# Patient Record
Sex: Male | Born: 1975 | Race: Black or African American | Hispanic: No | Marital: Married | Smoking: Current every day smoker
Health system: Southern US, Community
[De-identification: ages and names within clinical notes are randomized; demographics above are authoritative.]

## PROBLEM LIST (undated history)

## (undated) DIAGNOSIS — J40 Bronchitis, not specified as acute or chronic: Secondary | ICD-10-CM

## (undated) DIAGNOSIS — I10 Essential (primary) hypertension: Secondary | ICD-10-CM

---

## 1998-04-30 ENCOUNTER — Emergency Department (HOSPITAL_COMMUNITY): Admission: EM | Admit: 1998-04-30 | Discharge: 1998-04-30 | Payer: Self-pay | Admitting: Emergency Medicine

## 1998-04-30 ENCOUNTER — Encounter: Payer: Self-pay | Admitting: Emergency Medicine

## 1998-12-14 ENCOUNTER — Emergency Department (HOSPITAL_COMMUNITY): Admission: EM | Admit: 1998-12-14 | Discharge: 1998-12-14 | Payer: Self-pay | Admitting: Emergency Medicine

## 1999-11-26 ENCOUNTER — Emergency Department (HOSPITAL_COMMUNITY): Admission: EM | Admit: 1999-11-26 | Discharge: 1999-11-26 | Payer: Self-pay | Admitting: Emergency Medicine

## 2000-11-18 ENCOUNTER — Emergency Department (HOSPITAL_COMMUNITY): Admission: EM | Admit: 2000-11-18 | Discharge: 2000-11-18 | Payer: Self-pay | Admitting: *Deleted

## 2002-06-28 ENCOUNTER — Emergency Department (HOSPITAL_COMMUNITY): Admission: AD | Admit: 2002-06-28 | Discharge: 2002-06-28 | Payer: Self-pay | Admitting: Emergency Medicine

## 2002-06-28 ENCOUNTER — Encounter: Payer: Self-pay | Admitting: Emergency Medicine

## 2002-07-07 ENCOUNTER — Encounter: Admission: RE | Admit: 2002-07-07 | Discharge: 2002-07-07 | Payer: Self-pay | Admitting: Internal Medicine

## 2002-07-28 ENCOUNTER — Encounter: Admission: RE | Admit: 2002-07-28 | Discharge: 2002-07-28 | Payer: Self-pay | Admitting: Internal Medicine

## 2002-08-08 ENCOUNTER — Emergency Department (HOSPITAL_COMMUNITY): Admission: EM | Admit: 2002-08-08 | Discharge: 2002-08-08 | Payer: Self-pay | Admitting: Emergency Medicine

## 2002-08-08 ENCOUNTER — Encounter: Payer: Self-pay | Admitting: Emergency Medicine

## 2002-08-29 ENCOUNTER — Encounter: Admission: RE | Admit: 2002-08-29 | Discharge: 2002-08-29 | Payer: Self-pay | Admitting: Internal Medicine

## 2002-09-26 ENCOUNTER — Encounter: Admission: RE | Admit: 2002-09-26 | Discharge: 2002-09-26 | Payer: Self-pay | Admitting: Internal Medicine

## 2002-12-02 ENCOUNTER — Encounter: Admission: RE | Admit: 2002-12-02 | Discharge: 2002-12-02 | Payer: Self-pay | Admitting: Internal Medicine

## 2003-01-26 ENCOUNTER — Encounter: Admission: RE | Admit: 2003-01-26 | Discharge: 2003-01-26 | Payer: Self-pay | Admitting: Internal Medicine

## 2004-11-13 ENCOUNTER — Ambulatory Visit: Payer: Self-pay | Admitting: Internal Medicine

## 2004-11-14 ENCOUNTER — Ambulatory Visit: Payer: Self-pay | Admitting: Internal Medicine

## 2004-12-12 ENCOUNTER — Ambulatory Visit: Payer: Self-pay | Admitting: Internal Medicine

## 2005-03-28 ENCOUNTER — Emergency Department (HOSPITAL_COMMUNITY): Admission: EM | Admit: 2005-03-28 | Discharge: 2005-03-28 | Payer: Self-pay | Admitting: Family Medicine

## 2005-08-05 ENCOUNTER — Ambulatory Visit: Payer: Self-pay | Admitting: Hospitalist

## 2006-08-13 ENCOUNTER — Encounter: Payer: Self-pay | Admitting: Internal Medicine

## 2006-08-13 ENCOUNTER — Ambulatory Visit: Payer: Self-pay | Admitting: Internal Medicine

## 2006-08-13 LAB — CONVERTED CEMR LAB
ALT: 40 units/L (ref 0–53)
AST: 32 units/L (ref 0–37)
Albumin: 4.8 g/dL (ref 3.5–5.2)
Alkaline Phosphatase: 85 units/L (ref 39–117)
BUN: 9 mg/dL (ref 6–23)
CO2: 23 meq/L (ref 19–32)
Calcium: 9.7 mg/dL (ref 8.4–10.5)
Chloride: 101 meq/L (ref 96–112)
Creatinine, Ser: 0.92 mg/dL (ref 0.40–1.50)
Glucose, Bld: 96 mg/dL (ref 70–99)
Potassium: 3.6 meq/L (ref 3.5–5.3)
Sodium: 140 meq/L (ref 135–145)
Total Bilirubin: 0.4 mg/dL (ref 0.3–1.2)
Total Protein: 7.9 g/dL (ref 6.0–8.3)

## 2006-08-14 ENCOUNTER — Telehealth: Payer: Self-pay | Admitting: *Deleted

## 2006-08-21 ENCOUNTER — Ambulatory Visit: Payer: Self-pay | Admitting: Hospitalist

## 2006-08-21 ENCOUNTER — Encounter (INDEPENDENT_AMBULATORY_CARE_PROVIDER_SITE_OTHER): Payer: Self-pay | Admitting: Internal Medicine

## 2006-08-21 DIAGNOSIS — R05 Cough: Secondary | ICD-10-CM | POA: Insufficient documentation

## 2006-08-21 DIAGNOSIS — F17200 Nicotine dependence, unspecified, uncomplicated: Secondary | ICD-10-CM

## 2006-08-21 DIAGNOSIS — I10 Essential (primary) hypertension: Secondary | ICD-10-CM | POA: Insufficient documentation

## 2006-09-10 ENCOUNTER — Encounter (INDEPENDENT_AMBULATORY_CARE_PROVIDER_SITE_OTHER): Payer: Self-pay | Admitting: Internal Medicine

## 2006-09-10 ENCOUNTER — Ambulatory Visit: Payer: Self-pay | Admitting: Internal Medicine

## 2006-09-11 LAB — CONVERTED CEMR LAB
BUN: 10 mg/dL (ref 6–23)
CO2: 28 meq/L (ref 19–32)
Calcium: 9.9 mg/dL (ref 8.4–10.5)
Chloride: 101 meq/L (ref 96–112)
Creatinine, Ser: 1.02 mg/dL (ref 0.40–1.50)
Creatinine, Urine: 287.5 mg/dL
Glucose, Bld: 92 mg/dL (ref 70–99)
Microalb Creat Ratio: 3.8 mg/g (ref 0.0–30.0)
Microalb, Ur: 1.09 mg/dL (ref 0.00–1.89)
Potassium: 3.6 meq/L (ref 3.5–5.3)
Sodium: 141 meq/L (ref 135–145)

## 2008-03-20 ENCOUNTER — Telehealth (INDEPENDENT_AMBULATORY_CARE_PROVIDER_SITE_OTHER): Payer: Self-pay | Admitting: Internal Medicine

## 2012-04-19 ENCOUNTER — Encounter (HOSPITAL_COMMUNITY): Payer: Self-pay | Admitting: *Deleted

## 2012-04-19 ENCOUNTER — Emergency Department (HOSPITAL_COMMUNITY)
Admission: EM | Admit: 2012-04-19 | Discharge: 2012-04-20 | Disposition: A | Discharge status: Home / Self Care | Payer: 59 | Attending: Emergency Medicine | Admitting: Emergency Medicine

## 2012-04-19 ENCOUNTER — Emergency Department (HOSPITAL_COMMUNITY)
Admission: EM | Admit: 2012-04-19 | Discharge: 2012-04-19 | Disposition: A | Discharge status: Other Acute Inpatient Hospital | Payer: 59 | Source: Home / Self Care | Attending: Emergency Medicine | Admitting: Emergency Medicine

## 2012-04-19 ENCOUNTER — Emergency Department (INDEPENDENT_AMBULATORY_CARE_PROVIDER_SITE_OTHER): Payer: 59

## 2012-04-19 DIAGNOSIS — F17200 Nicotine dependence, unspecified, uncomplicated: Secondary | ICD-10-CM | POA: Insufficient documentation

## 2012-04-19 DIAGNOSIS — R000 Tachycardia, unspecified: Secondary | ICD-10-CM

## 2012-04-19 DIAGNOSIS — R0602 Shortness of breath: Secondary | ICD-10-CM

## 2012-04-19 DIAGNOSIS — I10 Essential (primary) hypertension: Secondary | ICD-10-CM

## 2012-04-19 DIAGNOSIS — J40 Bronchitis, not specified as acute or chronic: Secondary | ICD-10-CM

## 2012-04-19 HISTORY — DX: Essential (primary) hypertension: I10

## 2012-04-19 HISTORY — DX: Bronchitis, not specified as acute or chronic: J40

## 2012-04-19 LAB — COMPREHENSIVE METABOLIC PANEL
AST: 27 U/L (ref 0–37)
BUN: 8 mg/dL (ref 6–23)
CO2: 26 mEq/L (ref 19–32)
Chloride: 97 mEq/L (ref 96–112)
Creatinine, Ser: 1.01 mg/dL (ref 0.50–1.35)
GFR calc Af Amer: >90 mL/min (ref >90)
GFR calc non Af Amer: >90 mL/min (ref >90)
Glucose, Bld: 103 mg/dL — ABNORMAL HIGH (ref 70–99)
Total Bilirubin: 1.4 mg/dL — ABNORMAL HIGH (ref 0.3–1.2)

## 2012-04-19 LAB — COMPREHENSIVE METABOLIC PANEL WITH GFR
ALT: 22 U/L (ref 0–53)
Albumin: 4.1 g/dL (ref 3.5–5.2)
Alkaline Phosphatase: 101 U/L (ref 39–117)
Calcium: 9.7 mg/dL (ref 8.4–10.5)
Potassium: 3.4 meq/L — ABNORMAL LOW (ref 3.5–5.1)
Sodium: 135 meq/L (ref 135–145)
Total Protein: 7.7 g/dL (ref 6.0–8.3)

## 2012-04-19 LAB — CBC
HCT: 47.3 % (ref 39.0–52.0)
Hemoglobin: 17.2 g/dL — ABNORMAL HIGH (ref 13.0–17.0)
MCH: 32.4 pg (ref 26.0–34.0)
MCHC: 36.4 g/dL — ABNORMAL HIGH (ref 30.0–36.0)
MCV: 89.1 fL (ref 78.0–100.0)
Platelets: 233 10*3/uL (ref 150–400)
RBC: 5.31 MIL/uL (ref 4.22–5.81)
RDW: 12.9 % (ref 11.5–15.5)
WBC: 8.9 10*3/uL (ref 4.0–10.5)

## 2012-04-19 LAB — POCT I-STAT TROPONIN I

## 2012-04-19 MED ORDER — LISINOPRIL 20 MG PO TABS
20.0000 mg | ORAL_TABLET | Freq: Once | ORAL | Status: AC
  Administered 2012-04-19: 20 mg via ORAL
  Filled 2012-04-19: qty 1

## 2012-04-19 MED ORDER — ALBUTEROL SULFATE (5 MG/ML) 0.5% IN NEBU
5.0000 mg | INHALATION_SOLUTION | Freq: Once | RESPIRATORY_TRACT | Status: AC
  Administered 2012-04-19: 5 mg via RESPIRATORY_TRACT
  Filled 2012-04-19: qty 1

## 2012-04-19 MED ORDER — IPRATROPIUM BROMIDE 0.02 % IN SOLN
0.5000 mg | Freq: Once | RESPIRATORY_TRACT | Status: AC
  Administered 2012-04-19: 0.5 mg via RESPIRATORY_TRACT
  Filled 2012-04-19: qty 2.5

## 2012-04-19 MED ORDER — SODIUM CHLORIDE 0.9 % IV BOLUS (SEPSIS)
1000.0000 mL | Freq: Once | INTRAVENOUS | Status: AC
  Administered 2012-04-19: 1000 mL via INTRAVENOUS

## 2012-04-19 MED ORDER — LORAZEPAM 1 MG PO TABS
1.0000 mg | ORAL_TABLET | Freq: Once | ORAL | Status: AC
  Administered 2012-04-20: 1 mg via ORAL
  Filled 2012-04-19: qty 1

## 2012-04-19 MED ORDER — ALBUTEROL SULFATE HFA 108 (90 BASE) MCG/ACT IN AERS
1.0000 | INHALATION_SPRAY | Freq: Four times a day (QID) | RESPIRATORY_TRACT | Status: DC | PRN
  Administered 2012-04-19: 2 via RESPIRATORY_TRACT
  Filled 2012-04-19: qty 6.7

## 2012-04-19 MED ORDER — AZITHROMYCIN 250 MG PO TABS
500.0000 mg | ORAL_TABLET | Freq: Once | ORAL | Status: AC
  Administered 2012-04-20: 500 mg via ORAL
  Filled 2012-04-19: qty 2

## 2012-04-19 NOTE — ED Provider Notes (Signed)
History     CSN: [id number]  Arrival date & time 04/19/12  4098   First MD Initiated Contact with Patient 04/19/12 2218      Chief Complaint  Patient presents with  . Hypertension    (Consider location/radiation/quality/duration/timing/severity/associated sxs/prior treatment) HPI Comments: Pt is sent from her daycare do to hypertension and persistent tachycardia. Patient reports he's had approximately 2 weeks of congestion, chest cold, productive cough of yellow, brown, clear sputum. Patient admits he is a smoker but has quit for the past 2 days and feels like he is ready to quit. He denies any significant fevers but has had some chills. He reports also that he has been out of his blood pressure medication for about a month. He reports he recently tried to reach up to his physician for a appointment but has not established yet. He reports he takes he thinks 20 mg of lisinopril daily. Due to his congestion, he has also been taking several doses of over-the-counter cough and congestion medication including Tussionex and probably a decongestant as well. He denies any chest pain, headache, blurred vision, nausea or vomiting. He denies confusion or slurred speech, focal numbness or weakness. He was seen at urgent care, did obtain a chest x-ray and was referred to the emergency department for further evaluation.  Patient is a 37 y.o. male presenting with hypertension. The history is provided by the patient.  Hypertension Associated symptoms include shortness of breath. Pertinent negatives include no chest pain, no abdominal pain and no headaches.    Past Medical History  Diagnosis Date  . Hypertension   . Bronchitis     History reviewed. No pertinent past surgical history.  History reviewed. No pertinent family history.  History  Substance Use Topics  . Smoking status: Current Every Day Smoker  . Smokeless tobacco: Not on file  . Alcohol Use: Yes      Review of Systems   Constitutional: Positive for chills and diaphoresis. Negative for fever.  HENT: Positive for ear pain and congestion.   Respiratory: Positive for cough, shortness of breath and wheezing. Negative for chest tightness.   Cardiovascular: Positive for palpitations. Negative for chest pain.  Gastrointestinal: Negative for nausea, vomiting and abdominal pain.  Musculoskeletal: Negative for back pain.  Neurological: Negative for weakness, numbness and headaches.  All other systems reviewed and are negative.    Allergies  Apple and Citrus  Home Medications   Current Outpatient Rx  Name  Route  Sig  Dispense  Refill  . azithromycin (ZITHROMAX) 250 MG tablet   Oral   Take 1 tablet (250 mg total) by mouth daily.   6 tablet   0   . chlorpheniramine-HYDROcodone (TUSSIONEX PENNKINETIC ER) 10-8 MG/5ML LQCR   Oral   Take 5 mLs by mouth every 12 (twelve) hours as needed.   80 mL   0   . lisinopril (PRINIVIL,ZESTRIL) 20 MG tablet   Oral   Take 1 tablet (20 mg total) by mouth daily.   20 tablet   0     BP 191/133  Pulse 104  Temp(Src) 98.7 F (37.1 C) (Oral)  Resp 20  SpO2 98%  Physical Exam  Nursing note and vitals reviewed. Constitutional: He is oriented to person, place, and time. He appears well-developed and well-nourished. No distress.  HENT:  Head: Normocephalic and atraumatic.  Eyes: EOM are normal. No scleral icterus.  Neck: Normal range of motion. Neck supple.  Cardiovascular: Regular rhythm and normal heart sounds.  Tachycardia  present.   Pulmonary/Chest: Effort normal. No respiratory distress. He has wheezes.  Abdominal: Soft.  Neurological: He is alert and oriented to person, place, and time. No cranial nerve deficit. He exhibits normal muscle tone. Coordination normal.  Skin: Skin is warm. No rash noted. He is diaphoretic.    ED Course  Procedures (including critical care time)  Labs Reviewed  CBC - Abnormal; Notable for the following:    Hemoglobin 17.2  (*)    MCHC 36.4 (*)    All other components within normal limits  COMPREHENSIVE METABOLIC PANEL - Abnormal; Notable for the following:    Potassium 3.4 (*)    Glucose, Bld 103 (*)    Total Bilirubin 1.4 (*)    All other components within normal limits  POCT I-STAT TROPONIN I   Dg Chest 2 View  04/19/2012  *RADIOLOGY REPORT*  Clinical Data: 37 year old male with cough and shortness of breath for "a while" increasing times 2-3 days.  CHEST - 2 VIEW  Comparison: None.  Findings: The cardiac silhouette appears mild to moderately enlarged.  No pleural effusion.  Increased interstitial opacity diffusely.  It is unclear whether this is related to pulmonary vasculature. There is hilar enlargement, more apparent on the right.  No pneumothorax. Visualized tracheal air column is within normal limits. No consolidation or confluent pulmonary opacity. No acute osseous abnormality identified.  IMPRESSION: Increased pulmonary interstitial opacity diffusely.  Mild cardiomegaly, but no pleural fluid to suggest volume overload. Right greater than left hilar enlargement. Main differential considerations in this setting include: - Acute viral / atypical respiratory infection with mild reactive hilar nodes and - Chronic inflammatory process such as pulmonary sarcoidosis.   Original Report Authenticated By: Erskine Speed, M.D.      1. Hypertension   2. Bronchitis     ra sat is 99% and I interpret to be normal.  ECG at time 19:56 shows sinus tachycardia at rate 110, biatrial enlargement, LVH with mild repol abn's.  No prior ECG's, interpretation is abn ECG with no sig ischemic changes.    11:59 PM Pt feels improved after neb.  Will give inhaler, start on PO abx to cover acute bronchitis given symptoms ongoing for 2 weeks.  Pt stressed need to follow up with PCP.  Will prescribe lisinopril for home as well.   MDM  I reviewed CXR from urgent care showing lymphadenopathy likely confirming pt's symptoms of congestion  and cough for 2 weeks.  I suspect HTN and tachycardia and diaphoresis seen in the ED is from infectious process, fever and contributing factors are use of decongestants and non compliance.          William Palmer. Oletta Lamas, MD 04/20/12 1914

## 2012-04-19 NOTE — ED Notes (Signed)
Pt ambulated to restroom. 

## 2012-04-19 NOTE — ED Notes (Signed)
MD Ghim at bedside. 

## 2012-04-19 NOTE — ED Notes (Signed)
PT TO  BE  TRANSFERRED    TO  [facility]  ER  BY  SHUTTLE        PROVIDER     INQUIRED   IF  ANY  ANTI  HYPERTENSIVES  TO  BE  GIVEN PRIOR TO TRANSFER

## 2012-04-19 NOTE — ED Notes (Signed)
Pt  Has  Had  A  Cough  For  About  2   Weeks   With  Congestion        And  Shortness  Of  Breath            He  Has  Been taking  otc  meds     He  Is  Also  Non  Compliant  With  His  bp  meds  And  Has  Not  Been taking   His  lisinoprill       He  Says  His  bp  Has  Been  High  Recently   But he  Had  No idea  It was  This  High  He  Is  Awake  And  Alert and  Oriented  His  Skin is  Warm  And  Dry     He  Is    Awake  And  Alert  No  Neuro  deficiet  No  Headache

## 2012-04-19 NOTE — ED Provider Notes (Signed)
Medical screening examination/treatment/procedure(s) were performed by non-physician practitioner and as supervising physician I was immediately available for consultation/collaboration.  Leslee Home, M.D.  Reuben Likes, MD 04/19/12 2102

## 2012-04-19 NOTE — ED Provider Notes (Signed)
History     CSN: [id number]  Arrival date & time 04/19/12  1744   First MD Initiated Contact with Patient 04/19/12 1915      Chief Complaint  Patient presents with  . Cough    (Consider location/radiation/quality/duration/timing/severity/associated sxs/prior treatment) HPI Comments: Pt reports hx htn but not taking his bp med in over a month.  Also has been taking overdoses of otc cold medicines today to help relieve SOB.  Reports having recurrent bronchitis and feels has had bronchitis for last 2 weeks, progressively worsening.   Patient is a 37 y.o. male presenting with cough. The history is provided by the patient.  Cough Cough characteristics:  Non-productive Severity:  Severe Onset quality:  Gradual Duration:  2 weeks Timing:  Constant Progression:  Worsening Chronicity:  New Smoker: yes   Relieved by:  Nothing Ineffective treatments:  Cough suppressants and beta-agonist inhaler Associated symptoms: ear pain, shortness of breath and sinus congestion   Associated symptoms: no chest pain, no chills, no fever, no headaches, no rhinorrhea, no sore throat and no wheezing     Past Medical History  Diagnosis Date  . Hypertension     History reviewed. No pertinent past surgical history.  History reviewed. No pertinent family history.  History  Substance Use Topics  . Smoking status: Current Every Day Smoker  . Smokeless tobacco: Not on file  . Alcohol Use: Yes      Review of Systems  Constitutional: Negative for fever and chills.  HENT: Positive for ear pain, congestion and postnasal drip. Negative for sore throat and rhinorrhea.        Reports ear pressure feeling from congestion  Respiratory: Positive for cough and shortness of breath. Negative for wheezing.   Cardiovascular: Negative for chest pain, palpitations and leg swelling.  Neurological: Negative for dizziness, syncope and headaches.    Allergies  Review of patient's allergies indicates no known  allergies.  Home Medications   Current Outpatient Rx  Name  Route  Sig  Dispense  Refill  . LISINOPRIL PO   Oral   Take by mouth.           BP 206/152  Pulse 119  Temp(Src) 99.5 F (37.5 C) (Oral)  Resp 20  SpO2 99%  Physical Exam  Constitutional: He appears well-developed and well-nourished. No distress.  HENT:  Right Ear: External ear and ear canal normal. Tympanic membrane is retracted.  Left Ear: External ear and ear canal normal. Tympanic membrane is retracted.  Nose: Mucosal edema present. No rhinorrhea. Right sinus exhibits no maxillary sinus tenderness and no frontal sinus tenderness. Left sinus exhibits no maxillary sinus tenderness and no frontal sinus tenderness.  Mouth/Throat: Oropharynx is clear and moist.  Cardiovascular: Regular rhythm.  Tachycardia present.   Pulmonary/Chest: Effort normal and breath sounds normal.  Coughing    ED Course  Procedures (including critical care time)  Labs Reviewed - No data to display No results found.   1. HTN (hypertension)   2. Shortness of breath   3. Tachycardia       MDM  Pt taking overdoses of cold medicine today likely cause of tachycardia and severe htn.  SOB and enlarged heart on cxr concerning for heart failure/end organ damage. Transfer to ER for further care.         Cathlyn Parsons, NP 04/19/12 1920

## 2012-04-19 NOTE — ED Notes (Signed)
Pt ambulated to restroom. Urine specimen collected to hold.

## 2012-04-19 NOTE — ED Notes (Signed)
Phlebotomy at bedside.

## 2012-04-19 NOTE — ED Notes (Signed)
Pt sent from urgent care for elevated systolic and diastolic BP. Pt states he has been off his BP medications for a month and when that happens his BP spikes. Pt states that he has a hx of bronchitis and is also having SOB due to not taking his medications for that as well.

## 2012-04-19 NOTE — ED Notes (Signed)
Pt normally has high BP, pt takes lisinopril, but has been out for 30 days. Pt states he took some of his wifes BP meds but does not recall the name. Pt is taking decongestants for congestion he has had for x2 weeks.

## 2012-04-20 MED ORDER — HYDROCOD POLST-CHLORPHEN POLST 10-8 MG/5ML PO LQCR: 5.0000 mL | Freq: Two times a day (BID) | ORAL | Status: DC | PRN

## 2012-04-20 MED ORDER — LISINOPRIL 20 MG PO TABS: 20.0000 mg | ORAL_TABLET | Freq: Every day | ORAL | Status: DC

## 2012-04-20 MED ORDER — AZITHROMYCIN 250 MG PO TABS: 250.0000 mg | ORAL_TABLET | Freq: Every day | ORAL | Status: DC

## 2012-04-20 NOTE — Discharge Instructions (Signed)
Bronchitis  Bronchitis is the body's way of reacting to injury and/or infection (inflammation) of the bronchi. Bronchi are the air tubes that extend from the windpipe into the lungs. If the inflammation becomes severe, it may cause shortness of breath.  CAUSES   Inflammation may be caused by:   A virus.   Germs (bacteria).   Dust.   Allergens.   Pollutants and many other irritants.  The cells lining the bronchial tree are covered with tiny hairs (cilia). These constantly beat upward, away from the lungs, toward the mouth. This keeps the lungs free of pollutants. When these cells become too irritated and are unable to do their job, mucus begins to develop. This causes the characteristic cough of bronchitis. The cough clears the lungs when the cilia are unable to do their job. Without either of these protective mechanisms, the mucus would settle in the lungs. Then you would develop pneumonia.  Smoking is a common cause of bronchitis and can contribute to pneumonia. Stopping this habit is the single most important thing you can do to help yourself.  TREATMENT    Your caregiver may prescribe an antibiotic if the cough is caused by bacteria. Also, medicines that open up your airways make it easier to breathe. Your caregiver may also recommend or prescribe an expectorant. It will loosen the mucus to be coughed up. Only take over-the-counter or prescription medicines for pain, discomfort, or fever as directed by your caregiver.   Removing whatever causes the problem (smoking, for example) is critical to preventing the problem from getting worse.   Cough suppressants may be prescribed for relief of cough symptoms.   Inhaled medicines may be prescribed to help with symptoms now and to help prevent problems from returning.   For those with recurrent (chronic) bronchitis, there may be a need for steroid medicines.  SEEK IMMEDIATE MEDICAL CARE IF:    During treatment, you develop more pus-like mucus (purulent  sputum).   You have a fever.   Your baby is older than 3 months with a rectal temperature of 102 F (38.9 C) or higher.   Your baby is 78 months old or younger with a rectal temperature of 100.4 F (38 C) or higher.   You become progressively more ill.   You have increased difficulty breathing, wheezing, or shortness of breath.  It is necessary to seek immediate medical care if you are elderly or sick from any other disease.  MAKE SURE YOU:    Understand these instructions.   Will watch your condition.   Will get help right away if you are not doing well or get worse.  Document Released: 01/20/2005 Document Revised: 04/14/2011 Document Reviewed: 11/30/2007  [facility] Patient Information 2013 [place], [place].  Hypertension  As your heart beats, it forces blood through your arteries. This force is your blood pressure. If the pressure is too high, it is called hypertension (HTN) or high blood pressure. HTN is dangerous because you may have it and not know it. High blood pressure may mean that your heart has to work harder to pump blood. Your arteries may be narrow or stiff. The extra work puts you at risk for heart disease, stroke, and other problems.   Blood pressure consists of two numbers, a higher number over a lower, 110/72, for example. It is stated as "110 over 72." The ideal is below 120 for the top number (systolic) and under 80 for the bottom (diastolic). Write down your blood pressure today.  You  should pay close attention to your blood pressure if you have certain conditions such as:   Heart failure.   Prior heart attack.   Diabetes   Chronic kidney disease.   Prior stroke.   Multiple risk factors for heart disease.  To see if you have HTN, your blood pressure should be measured while you are seated with your arm held at the level of the heart. It should be measured at least twice. A one-time elevated blood pressure reading (especially in the Emergency Department) does not mean that you need  treatment. There may be conditions in which the blood pressure is different between your right and left arms. It is important to see your caregiver soon for a recheck.  Most people have essential hypertension which means that there is not a specific cause. This type of high blood pressure may be lowered by changing lifestyle factors such as:   Stress.   Smoking.   Lack of exercise.   Excessive weight.   Drug/tobacco/alcohol use.   Eating less salt.  Most people do not have symptoms from high blood pressure until it has caused damage to the body. Effective treatment can often prevent, delay or reduce that damage.  TREATMENT   When a cause has been identified, treatment for high blood pressure is directed at the cause. There are a large number of medications to treat HTN. These fall into several categories, and your caregiver will help you select the medicines that are best for you. Medications may have side effects. You should review side effects with your caregiver.  If your blood pressure stays high after you have made lifestyle changes or started on medicines,    Your medication(s) may need to be changed.   Other problems may need to be addressed.   Be certain you understand your prescriptions, and know how and when to take your medicine.   Be sure to follow up with your caregiver within the time frame advised (usually within two weeks) to have your blood pressure rechecked and to review your medications.   If you are taking more than one medicine to lower your blood pressure, make sure you know how and at what times they should be taken. Taking two medicines at the same time can result in blood pressure that is too low.  SEEK IMMEDIATE MEDICAL CARE IF:   You develop a severe headache, blurred or changing vision, or confusion.   You have unusual weakness or numbness, or a faint feeling.   You have severe chest or abdominal pain, vomiting, or breathing problems.  MAKE SURE YOU:    Understand these  instructions.   Will watch your condition.   Will get help right away if you are not doing well or get worse.  Document Released: 01/20/2005 Document Revised: 04/14/2011 Document Reviewed: 09/10/2007  [facility] Patient Information 2013 [place], [place].

## 2012-04-20 NOTE — ED Notes (Signed)
Pt ambulated to restroom. 

## 2013-07-19 ENCOUNTER — Emergency Department (INDEPENDENT_AMBULATORY_CARE_PROVIDER_SITE_OTHER): Payer: 59

## 2013-07-19 ENCOUNTER — Encounter (HOSPITAL_COMMUNITY): Payer: Self-pay | Admitting: Emergency Medicine

## 2013-07-19 ENCOUNTER — Emergency Department (INDEPENDENT_AMBULATORY_CARE_PROVIDER_SITE_OTHER)
Admission: EM | Admit: 2013-07-19 | Discharge: 2013-07-19 | Disposition: A | Discharge status: Home / Self Care | Payer: 59 | Source: Home / Self Care | Attending: Family Medicine | Admitting: Family Medicine

## 2013-07-19 DIAGNOSIS — X58XXXA Exposure to other specified factors, initial encounter: Secondary | ICD-10-CM

## 2013-07-19 DIAGNOSIS — S335XXA Sprain of ligaments of lumbar spine, initial encounter: Secondary | ICD-10-CM

## 2013-07-19 DIAGNOSIS — S39012A Strain of muscle, fascia and tendon of lower back, initial encounter: Secondary | ICD-10-CM

## 2013-07-19 MED ORDER — KETOROLAC TROMETHAMINE 30 MG/ML IJ SOLN
30.0000 mg | Freq: Once | INTRAMUSCULAR | Status: AC
  Administered 2013-07-19: 30 mg via INTRAMUSCULAR

## 2013-07-19 MED ORDER — METHYLPREDNISOLONE 4 MG PO KIT: PACK | ORAL | Status: DC

## 2013-07-19 MED ORDER — TRIAMCINOLONE ACETONIDE 40 MG/ML IJ SUSP
40.0000 mg | Freq: Once | INTRAMUSCULAR | Status: AC
  Administered 2013-07-19: 40 mg via INTRAMUSCULAR

## 2013-07-19 MED ORDER — METAXALONE 800 MG PO TABS: 800.0000 mg | ORAL_TABLET | Freq: Three times a day (TID) | ORAL | Status: DC

## 2013-07-19 MED ORDER — KETOROLAC TROMETHAMINE 30 MG/ML IJ SOLN
INTRAMUSCULAR | Status: AC
  Filled 2013-07-19: qty 1

## 2013-07-19 MED ORDER — TRIAMCINOLONE ACETONIDE 40 MG/ML IJ SUSP
INTRAMUSCULAR | Status: AC
  Filled 2013-07-19: qty 1

## 2013-07-19 NOTE — ED Provider Notes (Signed)
CSN: [id number]     Arrival date & time 07/19/13  1043 History   First MD Initiated Contact with Patient 07/19/13 1106     Chief Complaint  Patient presents with  . Leg Pain   (Consider location/radiation/quality/duration/timing/severity/associated sxs/prior Treatment) Patient is a 38 y.o. male presenting with leg pain. The history is provided by the patient.  Leg Pain Location:  Leg Time since incident:  4 days Injury: yes   Mechanism of injury comment:  Lifting heavy tables at work.last week. Leg location:  R upper leg and R lower leg Pain details:    Severity:  Mild   Onset quality:  Sudden   Progression:  Partially resolved Chronicity:  Recurrent (h/o sciatica 8-9 yrs ago.) Dislocation: no   Prior injury to area:  Yes Relieved by:  Rest and muscle relaxant Associated symptoms: back pain and stiffness   Associated symptoms: no decreased ROM, no fever, no numbness, no swelling and no tingling   Risk factors: no obesity     Past Medical History  Diagnosis Date  . Hypertension   . Bronchitis    History reviewed. No pertinent past surgical history. History reviewed. No pertinent family history. History  Substance Use Topics  . Smoking status: Current Every Day Smoker  . Smokeless tobacco: Not on file  . Alcohol Use: Yes    Review of Systems  Constitutional: Negative.  Negative for fever.  Gastrointestinal: Negative.   Genitourinary: Negative.   Musculoskeletal: Positive for back pain and stiffness. Negative for gait problem.  Neurological: Negative for weakness and numbness.    Allergies  Apple and Citrus  Home Medications   Prior to Admission medications   Medication Sig Start Date End Date Taking? Authorizing Provider  azithromycin (ZITHROMAX) 250 MG tablet Take 1 tablet (250 mg total) by mouth daily. 04/20/12   Gavin PoundMichael Y. Ghim, MD  chlorpheniramine-HYDROcodone (TUSSIONEX PENNKINETIC ER) 10-8 MG/5ML LQCR Take 5 mLs by mouth every 12 (twelve) hours as needed.  04/20/12   Gavin PoundMichael Y. Ghim, MD  lisinopril (PRINIVIL,ZESTRIL) 20 MG tablet Take 1 tablet (20 mg total) by mouth daily. 04/20/12   Gavin PoundMichael Y. Ghim, MD  metaxalone (SKELAXIN) 800 MG tablet Take 1 tablet (800 mg total) by mouth 3 (three) times daily. 07/19/13   Linna HoffJames D Kindl, MD  methylPREDNISolone (MEDROL DOSEPAK) 4 MG tablet follow package directions, start on wed, take until finished 07/19/13   Linna HoffJames D Kindl, MD   BP 145/104  Pulse 92  Temp(Src) 98.4 F (36.9 C) (Oral)  Resp 16  SpO2 99% Physical Exam  Nursing note and vitals reviewed. Constitutional: He is oriented to person, place, and time. He appears well-developed and well-nourished. No distress.  Abdominal: Soft. Bowel sounds are normal.  Musculoskeletal: Normal range of motion. He exhibits tenderness.       Lumbar back: He exhibits tenderness and pain. He exhibits normal range of motion, no bony tenderness, no deformity and normal pulse.       Back:  Neurological: He is alert and oriented to person, place, and time.  Skin: Skin is warm.    ED Course  Procedures (including critical care time) Labs Review Labs Reviewed - No data to display  Imaging Review Dg Lumbar Spine Complete  07/19/2013   CLINICAL DATA:  Low back pain  EXAM: LUMBAR SPINE - COMPLETE 4+ VIEW  COMPARISON:  None.  FINDINGS: Five lumbar type vertebral bodies are well visualized. Vertebral body height is well maintained. No spondylolysis or spondylolisthesis is seen. No significant  disc space narrowing is noted. A 9 mm nonobstructing stone is noted in the lower pole of the right kidney. No other focal abnormal calcifications are seen.  IMPRESSION: No acute abnormality in the lumbar spine.  9 mm right renal stone in the lower pole.   Electronically Signed   By: Alcide CleverMark  Lukens M.D.   On: 07/19/2013 12:30   X-rays reviewed and report per radiologist.   MDM      Linna HoffJames D Kindl, MD 07/19/13 1304

## 2013-07-19 NOTE — ED Notes (Signed)
Pt  Reports  Pain  /  Numbness  Down r  Leg        -  Pt  denys  Any  Injury            -  Pt  Reports  Was  Lifting    4  Days  Ago  And   Developed  The  Symptoms    Pt  Ambulated  To  Room with a  Steady  Fluid  Gait         -   Sitting  Upright on  Exam table   Speaking in  Complete  sentances

## 2015-01-21 IMAGING — CR DG LUMBAR SPINE COMPLETE 4+V
5 series · 5 of 5 positions shown · non-contrast
Comparison: None.

CLINICAL DATA: Low back pain

EXAM:
LUMBAR SPINE - COMPLETE 4+ VIEW

[view not recorded (1 of 5)]
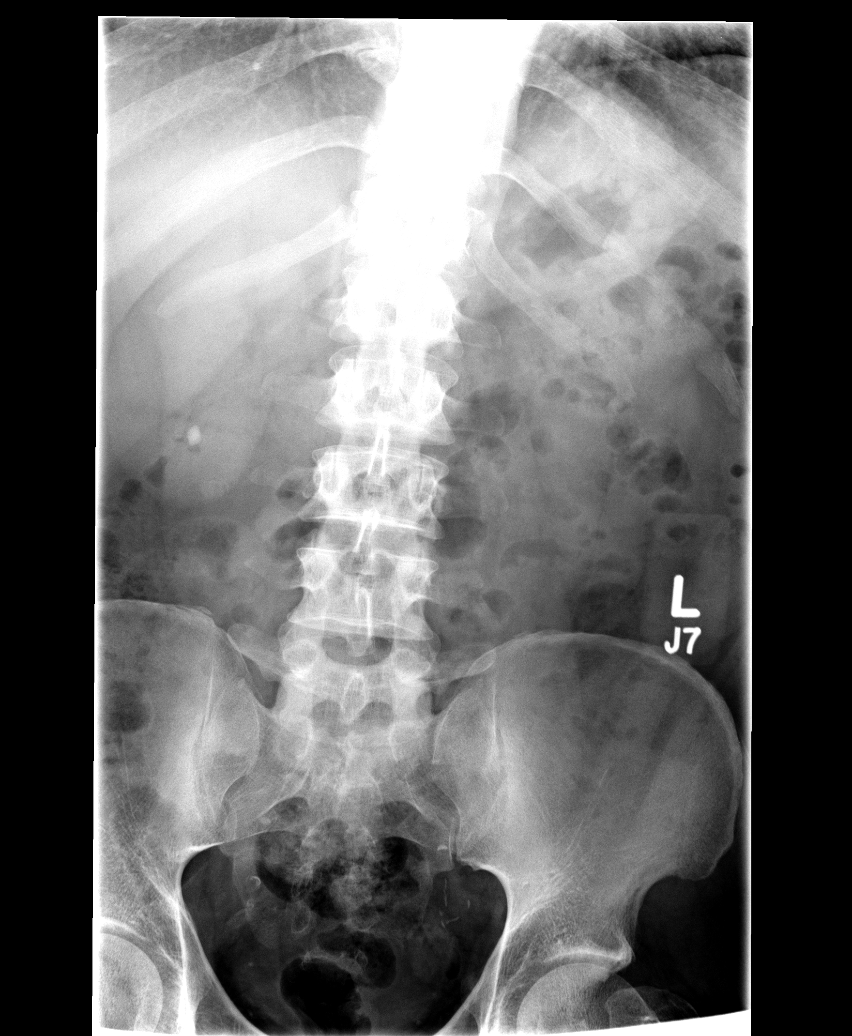

[view not recorded (2 of 5)]
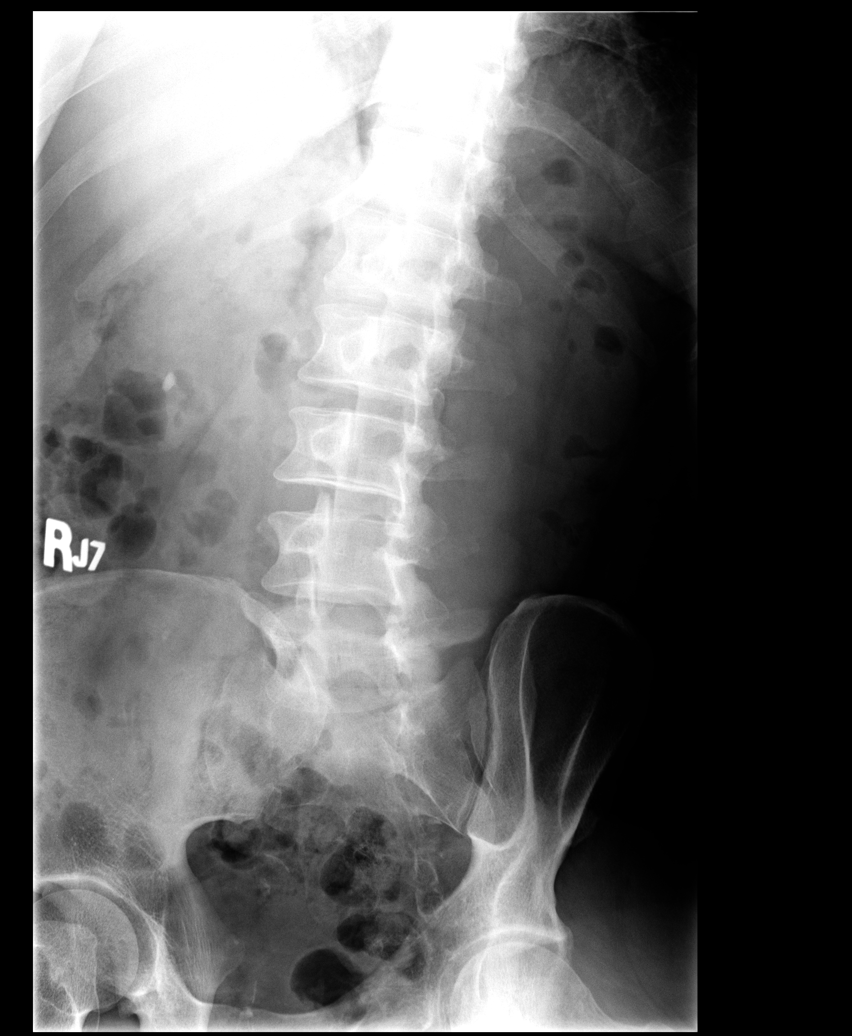

[view not recorded (3 of 5)]
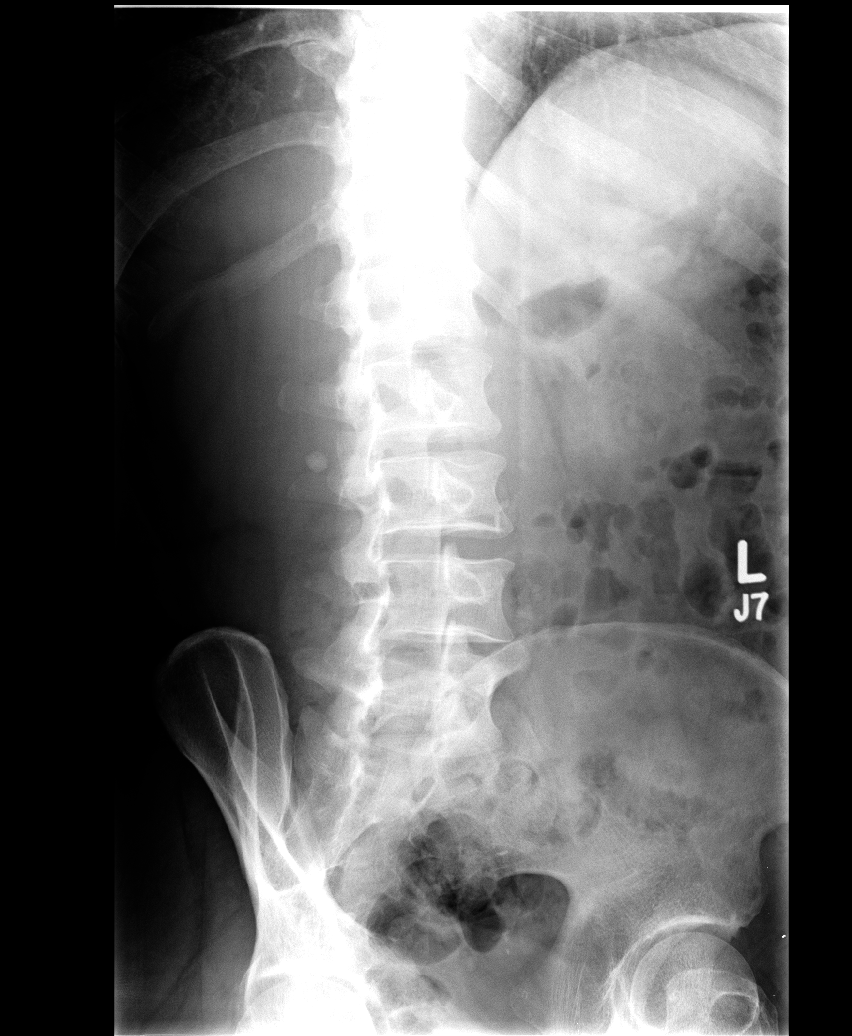

[view not recorded (4 of 5)]
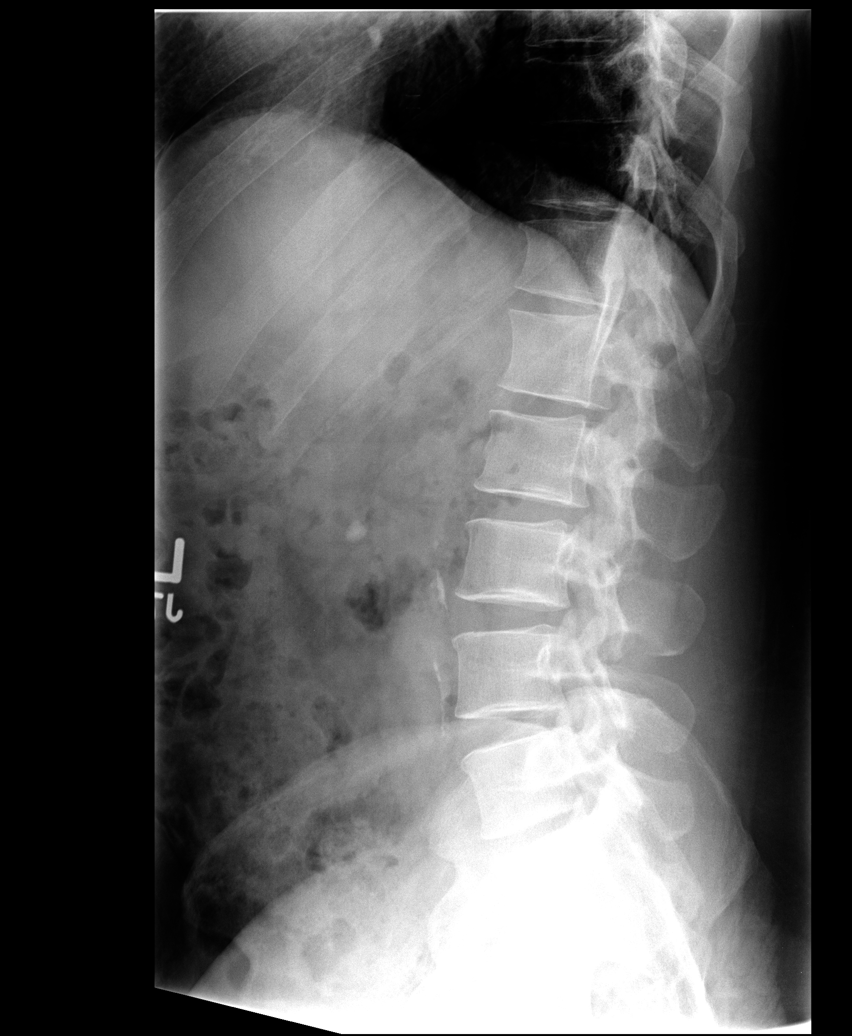

[view not recorded (5 of 5)]
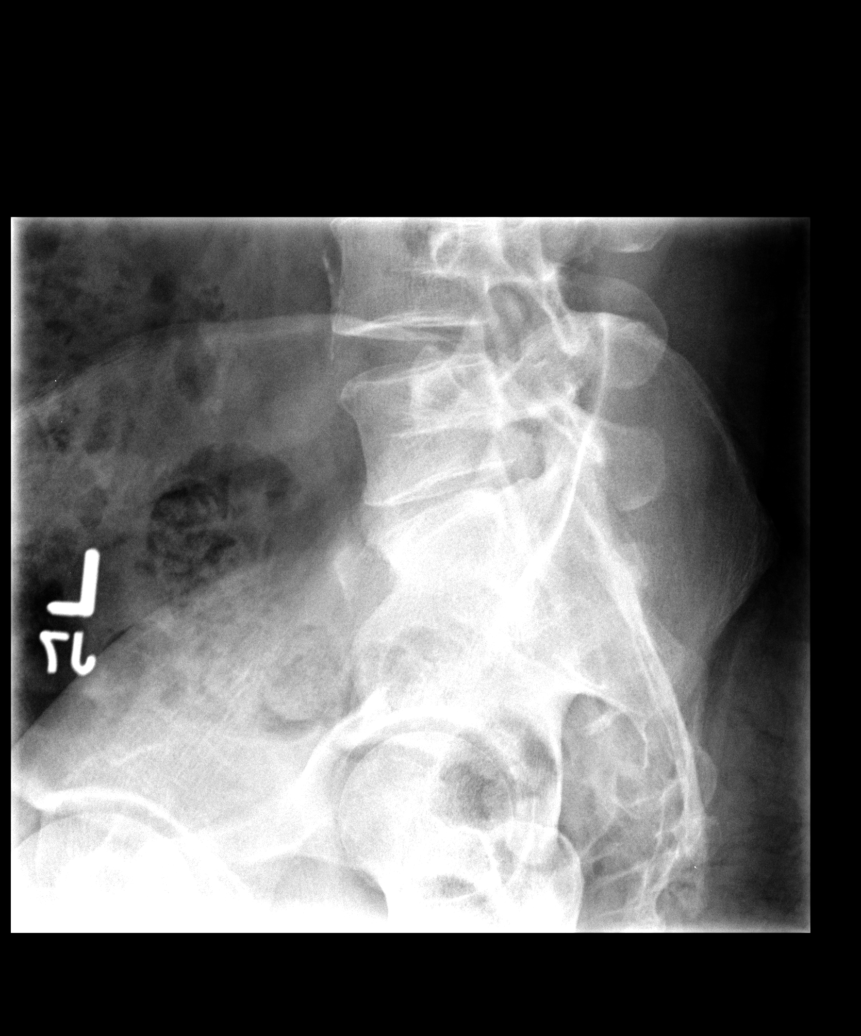

[5 of 5 positions shown; findings below may reference images not displayed]

FINDINGS: Five lumbar type vertebral bodies are well visualized. Vertebral
body height is well maintained. No spondylolysis or
spondylolisthesis is seen. No significant disc space narrowing is
noted. A 9 mm nonobstructing stone is noted in the lower pole of the
right kidney. No other focal abnormal calcifications are seen.
IMPRESSION: No acute abnormality in the lumbar spine.

9 mm right renal stone in the lower pole.

## 2015-02-15 DIAGNOSIS — I10 Essential (primary) hypertension: Secondary | ICD-10-CM | POA: Diagnosis not present

## 2015-02-15 DIAGNOSIS — N529 Male erectile dysfunction, unspecified: Secondary | ICD-10-CM | POA: Diagnosis not present

## 2016-01-10 DIAGNOSIS — R7309 Other abnormal glucose: Secondary | ICD-10-CM | POA: Diagnosis not present

## 2016-01-10 DIAGNOSIS — N529 Male erectile dysfunction, unspecified: Secondary | ICD-10-CM | POA: Diagnosis not present

## 2016-01-10 DIAGNOSIS — I10 Essential (primary) hypertension: Secondary | ICD-10-CM | POA: Diagnosis not present

## 2016-01-10 DIAGNOSIS — R0982 Postnasal drip: Secondary | ICD-10-CM | POA: Diagnosis not present

## 2016-12-29 DIAGNOSIS — J302 Other seasonal allergic rhinitis: Secondary | ICD-10-CM | POA: Diagnosis not present

## 2016-12-29 DIAGNOSIS — I10 Essential (primary) hypertension: Secondary | ICD-10-CM | POA: Diagnosis not present

## 2016-12-29 DIAGNOSIS — N529 Male erectile dysfunction, unspecified: Secondary | ICD-10-CM | POA: Diagnosis not present

## 2016-12-29 DIAGNOSIS — R7303 Prediabetes: Secondary | ICD-10-CM | POA: Diagnosis not present

## 2018-05-25 DIAGNOSIS — I10 Essential (primary) hypertension: Secondary | ICD-10-CM | POA: Diagnosis not present

## 2018-05-25 DIAGNOSIS — R7303 Prediabetes: Secondary | ICD-10-CM | POA: Diagnosis not present

## 2018-05-25 DIAGNOSIS — N529 Male erectile dysfunction, unspecified: Secondary | ICD-10-CM | POA: Diagnosis not present

## 2018-05-25 DIAGNOSIS — J302 Other seasonal allergic rhinitis: Secondary | ICD-10-CM | POA: Diagnosis not present

## 2018-08-27 DIAGNOSIS — B342 Coronavirus infection, unspecified: Secondary | ICD-10-CM | POA: Diagnosis not present

## 2018-10-25 DIAGNOSIS — R05 Cough: Secondary | ICD-10-CM | POA: Diagnosis not present

## 2018-10-25 DIAGNOSIS — I10 Essential (primary) hypertension: Secondary | ICD-10-CM | POA: Diagnosis not present

## 2018-10-25 DIAGNOSIS — J309 Allergic rhinitis, unspecified: Secondary | ICD-10-CM | POA: Diagnosis not present

## 2018-10-25 DIAGNOSIS — N529 Male erectile dysfunction, unspecified: Secondary | ICD-10-CM | POA: Diagnosis not present

## 2020-07-19 ENCOUNTER — Other Ambulatory Visit: Payer: Self-pay

## 2020-07-19 ENCOUNTER — Encounter (HOSPITAL_COMMUNITY): Payer: Self-pay

## 2020-07-19 ENCOUNTER — Ambulatory Visit (HOSPITAL_COMMUNITY)
Admission: EM | Admit: 2020-07-19 | Discharge: 2020-07-19 | Disposition: A | Discharge status: Home / Self Care | Payer: 59 | Attending: Urgent Care | Admitting: Urgent Care

## 2020-07-19 DIAGNOSIS — S01511A Laceration without foreign body of lip, initial encounter: Secondary | ICD-10-CM

## 2020-07-19 MED ORDER — AMOXICILLIN-POT CLAVULANATE 875-125 MG PO TABS: 1.0000 | ORAL_TABLET | Freq: Two times a day (BID) | ORAL | 0 refills | Status: AC

## 2020-07-19 NOTE — ED Provider Notes (Signed)
[facility]    CSN: [id number] Arrival date & time: 07/19/20  0959      History   Chief Complaint Chief Complaint  Patient presents with   Laceration    HPI William Palmer is a 45 y.o. male.   Patient here for evaluation of a lip laceration that occurred 2 days ago.  Reports pain and swelling has been worsening now has some drainage.  Reports thought that it would heal on its own but it has not.  Reports cut lip when he tripped and fell.  Has not tried any OTC medications or treatments.    Denies any specific alleviating or aggravating factors.  Denies any fevers, chest pain, shortness of breath, N/V/D, numbness, tingling, weakness, abdominal pain, or headaches.     The history is provided by the patient.  Laceration  Past Medical History:  Diagnosis Date   Bronchitis    Hypertension     Patient Active Problem List   Diagnosis Date Noted   TOBACCO ABUSE 08/21/2006   ESSENTIAL HYPERTENSION 08/21/2006   COUGH, CHRONIC 08/21/2006    History reviewed. No pertinent surgical history.     Home Medications    Prior to Admission medications   Medication Sig Start Date End Date Taking? Authorizing Provider  amoxicillin-clavulanate (AUGMENTIN) 875-125 MG tablet Take 1 tablet by mouth every 12 (twelve) hours for 7 days. 07/19/20 07/26/20 Yes Ivette Loyal, NP  azithromycin (ZITHROMAX) 250 MG tablet Take 1 tablet (250 mg total) by mouth daily. 04/20/12   Quita Skye, MD  chlorpheniramine-HYDROcodone (TUSSIONEX PENNKINETIC ER) 10-8 MG/5ML LQCR Take 5 mLs by mouth every 12 (twelve) hours as needed. 04/20/12   Quita Skye, MD  hydrochlorothiazide (HYDRODIURIL) 25 MG tablet TAKE 1 TABLET BY MOUTH EVERY DAY IN THE MORNING FOR 90 DAYS 06/11/20   [provider]  lisinopril (PRINIVIL,ZESTRIL) 20 MG tablet Take 1 tablet (20 mg total) by mouth daily. 04/20/12   Quita Skye, MD  losartan (COZAAR) 100 MG tablet 1 tablet    [provider]  metaxalone  (SKELAXIN) 800 MG tablet Take 1 tablet (800 mg total) by mouth 3 (three) times daily. 07/19/13   Linna Hoff, MD  methylPREDNISolone (MEDROL DOSEPAK) 4 MG tablet follow package directions, start on wed, take until finished 07/19/13   Kindl, Quita Skye, MD    Family History History reviewed. No pertinent family history.  Social History Social History   Tobacco Use   Smoking status: Every Day    Pack years: 0.00  Substance Use Topics   Alcohol use: Yes     Allergies   Amlodipine besylate, Apple, Citrus, and Lisinopril   Review of Systems Review of Systems  Skin:  Positive for wound.  All other systems reviewed and are negative.   Physical Exam Triage Vital Signs ED Triage Vitals [07/19/20 1030]  Enc Vitals Group     BP (!) 151/111     Pulse Rate 92     Resp 20     Temp 97.7 F (36.5 C)     Temp Source Axillary     SpO2 98 %     Weight      Height      Head Circumference      Peak Flow      Pain Score 9     Pain Loc      Pain Edu?      Excl. in GC?    No data found.  Updated Vital Signs BP Marland Kitchen)  151/111 (BP Location: Left Arm)   Pulse 92   Temp 97.7 F (36.5 C) (Axillary) Comment: Laceration in lip  Resp 20   SpO2 98%   Visual Acuity Right Eye Distance:   Left Eye Distance:   Bilateral Distance:    Right Eye Near:   Left Eye Near:    Bilateral Near:     Physical Exam Vitals and nursing note reviewed.  Constitutional:      General: He is not in acute distress.    Appearance: Normal appearance. He is not ill-appearing, toxic-appearing or diaphoretic.  HENT:     Head: Normocephalic and atraumatic.  Eyes:     Conjunctiva/sclera: Conjunctivae normal.  Cardiovascular:     Rate and Rhythm: Normal rate.     Pulses: Normal pulses.  Pulmonary:     Effort: Pulmonary effort is normal.  Abdominal:     General: Abdomen is flat.  Musculoskeletal:        General: Normal range of motion.     Cervical back: Normal range of motion.  Skin:    General: Skin  is warm and dry.     Findings: Laceration (lower lip, see photo below) present.  Neurological:     General: No focal deficit present.     Mental Status: He is alert and oriented to person, place, and time.  Psychiatric:        Mood and Affect: Mood normal.      UC Treatments / Results  Labs (all labs ordered are listed, but only abnormal results are displayed) Labs Reviewed - No data to display  EKG   Radiology No results found.  Procedures Procedures (including critical care time)  Medications Ordered in UC Medications - No data to display  Initial Impression / Assessment and Plan / UC Course  I have reviewed the triage vital signs and the nursing notes.  Pertinent labs & imaging results that were available during my care of the patient were reviewed by me and considered in my medical decision making (see chart for details).    Due to the age of the wound, sutures are not able to be placed at this time.  Augmentin twice a day for the next 7 days.  Referral placed for plastic surgery follow up.  Patient may call Plastic Surgery office if he has not heard anything by this afternoon.  Alternatively, he can follow up with the Emergency department for any worsening symptoms.  Tylenol and/or ibuprofen as needed for pain.   Final Clinical Impressions(s) / UC Diagnoses   Final diagnoses:  Lip laceration, initial encounter     Discharge Instructions      [facility] Phone: [phone]  Take the Augmentin twice a day for the next 7 days.    Someone should call you from the Plastic Surgery office, but if you haven't heard anything by this afternoon- you can call them at the number listed above.    Go to the emergency department for further evaluation or any worsening symptoms.       ED Prescriptions     Medication Sig Dispense Auth. Provider   amoxicillin-clavulanate (AUGMENTIN) 875-125 MG tablet Take 1 tablet by mouth  every 12 (twelve) hours for 7 days. 14 tablet Ivette Loyal, NP      PDMP not reviewed this encounter.   Ivette Loyal, NP 07/19/20 1051

## 2020-07-19 NOTE — ED Triage Notes (Signed)
Pt presents with laceration in the lower lip x 2 days.

## 2020-07-19 NOTE — ED Notes (Signed)
Blood pressure reported to Smith, NP.  

## 2020-07-19 NOTE — Discharge Instructions (Addendum)
[facility] Phone: [phone]  Take the Augmentin twice a day for the next 7 days.    Someone should call you from the Plastic Surgery office, but if you haven't heard anything by this afternoon- you can call them at the number listed above.    Go to the emergency department for further evaluation or any worsening symptoms.

## 2020-07-26 ENCOUNTER — Encounter: Payer: Self-pay | Admitting: Surgical

## 2020-07-26 ENCOUNTER — Other Ambulatory Visit: Payer: Self-pay

## 2020-07-26 ENCOUNTER — Ambulatory Visit (INDEPENDENT_AMBULATORY_CARE_PROVIDER_SITE_OTHER): Payer: 59 | Admitting: Surgical

## 2020-07-26 VITALS — BP 156/109 | HR 97 | Ht 66.0 in | Wt 231.2 lb

## 2020-07-26 DIAGNOSIS — S01511A Laceration without foreign body of lip, initial encounter: Secondary | ICD-10-CM

## 2020-07-26 DIAGNOSIS — F17200 Nicotine dependence, unspecified, uncomplicated: Secondary | ICD-10-CM

## 2020-07-26 NOTE — Progress Notes (Signed)
Referring Provider Tally Joe, MD [phone] [place],  [place] [address]   CC:  Chief Complaint  Patient presents with   Advice Only      William Palmer is an 45 y.o. male.  HPI: Patient is a 45 year old male here for evaluation of his lip.  He sustained a lip laceration on 07/17/2020 after a trip and fall.  He was seen in the ED on 07/19/2020 and reported that he thought it would heal on its own.   In the ED patient was provided Augmentin for 7 days.  He was recommended to follow-up in plastic surgery office.  He has a past medical history of bronchitis, hypertension. He smokes 1/2 pack of cigarettes per day.  He reports that overall he is doing much better over the past few days.  He has been controlling his pain with Tylenol and ibuprofen.  He has completed the antibiotic.  He is not having any infectious symptoms at this time.  No fevers or chills.  No nausea or vomiting.  He reports that he has been applying peroxide, hydrocortisone cream and antibiotic ointment to the lip wound and covering it with a Band-Aid every day.  Allergies  Allergen Reactions   Amlodipine Besylate     Other reaction(s): visual changes left eye   Apple     Unknown   Citrus     Unknown   Lisinopril     Other reaction(s): cough/cramping    Outpatient Encounter Medications as of 07/26/2020  Medication Sig   amoxicillin-clavulanate (AUGMENTIN) 875-125 MG tablet Take 1 tablet by mouth every 12 (twelve) hours for 7 days.   azithromycin (ZITHROMAX) 250 MG tablet Take 1 tablet (250 mg total) by mouth daily.   chlorpheniramine-HYDROcodone (TUSSIONEX PENNKINETIC ER) 10-8 MG/5ML LQCR Take 5 mLs by mouth every 12 (twelve) hours as needed.   hydrochlorothiazide (HYDRODIURIL) 25 MG tablet TAKE 1 TABLET BY MOUTH EVERY DAY IN THE MORNING FOR 90 DAYS   lisinopril (PRINIVIL,ZESTRIL) 20 MG tablet Take 1 tablet (20 mg total) by mouth daily.   losartan (COZAAR) 100 MG tablet 1 tablet    metaxalone (SKELAXIN) 800 MG tablet Take 1 tablet (800 mg total) by mouth 3 (three) times daily.   methylPREDNISolone (MEDROL DOSEPAK) 4 MG tablet follow package directions, start on wed, take until finished   No facility-administered encounter medications on file as of 07/26/2020.     Past Medical History:  Diagnosis Date   Bronchitis    Hypertension     No past surgical history on file.  No family history on file.  Social History   Social History Narrative   Not on file     Review of Systems General: Denies fevers, chills, no nausea or vomiting    Physical Exam Vitals with BMI 07/26/2020 07/19/2020 07/19/2013  Height 5\' 6"  - -  Weight 231 lbs 3 oz - -  BMI 37.33 - -  Systolic 156 151  Diastolic 109 111 144  Pulse 97 92 92    General:  No acute distress,  Alert and oriented, Non-Toxic, Normal speech and affect Lip: Lower lip laceration with good base of granulation tissue noted and new epithelium noted at the edges.  Significantly improved from previous EMR photos on 07/19/2020.  No foul odors are noted.  Minimal tenderness is noted.  Minimal swelling is noted.  It does not involve the vermilion border. Pulmonary: Breathing is unlabored. Psych: Normal behavior and mood.  Assessment/Plan   Patient is a 45 year old male who presents for evaluation 9 days after lower lip laceration from a fall.  He was initially evaluated in the ED on 07/19/2020.  He was prescribed antibiotic which she has finished.  He is not having any infectious symptoms.  He is not having much pain.  We discussed a few options at this point.  Given that it is healing well and the pain has improved we could certainly continue with local wound care and allow this to heal on its own and then reevaluate for any surgical procedure necessary for improvement in cosmesis.  We also discussed that we could primarily close the laceration at this time, however this would be at an increased risk of infection  and may not provide much improvement.  Patient opted to avoid surgical intervention at this time and would like to follow-up in 4 weeks for reevaluation.  I think that this is a great plan, I recommend he use antibiotic ointment daily.  I recommend he avoid smoking to prevent increased risk of infection and delayed healing.  I recommend avoiding hydrocortisone cream.  Picture was taken and placed in the patient's chart with patient's permission.  Kermit Balo Beadie Matsunaga 07/26/2020, 11:12 AM

## 2020-08-21 ENCOUNTER — Ambulatory Visit: Payer: 59 | Admitting: Surgical

## 2020-08-22 ENCOUNTER — Institutional Professional Consult (permissible substitution): Payer: 59 | Admitting: Plastic Surgery

## 2022-11-13 ENCOUNTER — Encounter (HOSPITAL_COMMUNITY): Payer: Self-pay

## 2022-11-13 ENCOUNTER — Ambulatory Visit (HOSPITAL_COMMUNITY)
Admission: EM | Admit: 2022-11-13 | Discharge: 2022-11-13 | Disposition: A | Discharge status: Home / Self Care | Payer: Self-pay | Attending: Emergency Medicine | Admitting: Emergency Medicine

## 2022-11-13 DIAGNOSIS — M791 Myalgia, unspecified site: Secondary | ICD-10-CM

## 2022-11-13 DIAGNOSIS — M545 Low back pain, unspecified: Secondary | ICD-10-CM

## 2022-11-13 MED ORDER — MELOXICAM 15 MG PO TABS: 15.0000 mg | ORAL_TABLET | Freq: Every day | ORAL | 0 refills | Status: AC

## 2022-11-13 MED ORDER — BACLOFEN 10 MG PO TABS: 10.0000 mg | ORAL_TABLET | Freq: Two times a day (BID) | ORAL | 0 refills | Status: AC | PRN

## 2022-11-13 NOTE — Discharge Instructions (Addendum)
You can take the muscle relaxer Baclofen twice daily. If the medication makes you drowsy, take only at bed time.  The mobic can be taken once daily. This is an anti-inflammatory similar to ibuprofen. Do not use other NSAIDs while taking the mobic. Take with food!  You can use lidocaine patches or other topical treatments such as SalonPas, IcyHot, BioFreeze, etc  Avoid heavy lifting and strenuous activity  It may take several days for symptoms to resolve.  Please return or follow up with your primary care provider if needed.
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
